# Patient Record
Sex: Female | Born: 1960
Health system: Southern US, Community
[De-identification: ages and names within clinical notes are randomized; demographics above are authoritative.]

## PROBLEM LIST (undated history)

## (undated) DIAGNOSIS — J439 Emphysema, unspecified: Secondary | ICD-10-CM

## (undated) DIAGNOSIS — I10 Essential (primary) hypertension: Secondary | ICD-10-CM

## (undated) HISTORY — DX: Emphysema, unspecified: J43.9

## (undated) HISTORY — DX: Essential (primary) hypertension: I10

## (undated) HISTORY — PX: ROTATOR CUFF REPAIR: SHX139

---

## 2005-07-04 ENCOUNTER — Ambulatory Visit: Payer: Self-pay | Admitting: *Deleted

## 2005-12-17 ENCOUNTER — Ambulatory Visit: Payer: Self-pay | Admitting: Family Medicine

## 2006-07-29 ENCOUNTER — Ambulatory Visit: Payer: Self-pay | Admitting: Family Medicine

## 2007-08-04 ENCOUNTER — Ambulatory Visit: Payer: Self-pay | Admitting: Family Medicine

## 2009-08-07 ENCOUNTER — Ambulatory Visit: Payer: Self-pay | Admitting: Anesthesiology

## 2010-06-26 ENCOUNTER — Ambulatory Visit: Payer: Self-pay | Admitting: Obstetrics and Gynecology

## 2011-06-27 ENCOUNTER — Ambulatory Visit: Payer: Self-pay | Admitting: Obstetrics and Gynecology

## 2012-07-01 ENCOUNTER — Ambulatory Visit: Payer: Self-pay | Admitting: Obstetrics and Gynecology

## 2013-06-24 ENCOUNTER — Ambulatory Visit: Payer: Self-pay | Admitting: Obstetrics and Gynecology

## 2014-07-05 ENCOUNTER — Ambulatory Visit: Payer: Self-pay | Admitting: Obstetrics and Gynecology

## 2015-07-13 ENCOUNTER — Other Ambulatory Visit: Payer: Self-pay | Admitting: Internal Medicine

## 2015-07-13 DIAGNOSIS — Z1231 Encounter for screening mammogram for malignant neoplasm of breast: Secondary | ICD-10-CM

## 2015-07-18 ENCOUNTER — Ambulatory Visit
Admission: RE | Admit: 2015-07-18 | Discharge: 2015-07-18 | Disposition: A | Discharge status: Home / Self Care | Payer: BLUE CROSS/BLUE SHIELD | Source: Ambulatory Visit | Attending: Internal Medicine | Admitting: Internal Medicine

## 2015-07-18 DIAGNOSIS — Z1231 Encounter for screening mammogram for malignant neoplasm of breast: Secondary | ICD-10-CM | POA: Insufficient documentation

## 2016-05-23 DIAGNOSIS — Z0000 Encounter for general adult medical examination without abnormal findings: Secondary | ICD-10-CM | POA: Diagnosis not present

## 2016-07-09 ENCOUNTER — Other Ambulatory Visit: Payer: Self-pay | Admitting: Internal Medicine

## 2016-07-09 DIAGNOSIS — Z1231 Encounter for screening mammogram for malignant neoplasm of breast: Secondary | ICD-10-CM

## 2016-07-24 ENCOUNTER — Ambulatory Visit
Admission: RE | Admit: 2016-07-24 | Discharge: 2016-07-24 | Disposition: A | Discharge status: Home / Self Care | Payer: BLUE CROSS/BLUE SHIELD | Source: Ambulatory Visit | Attending: Internal Medicine | Admitting: Internal Medicine

## 2016-07-24 DIAGNOSIS — Z1231 Encounter for screening mammogram for malignant neoplasm of breast: Secondary | ICD-10-CM | POA: Diagnosis not present

## 2017-04-29 ENCOUNTER — Encounter: Payer: Self-pay | Admitting: Obstetrics and Gynecology

## 2017-04-29 ENCOUNTER — Ambulatory Visit (INDEPENDENT_AMBULATORY_CARE_PROVIDER_SITE_OTHER): Payer: BLUE CROSS/BLUE SHIELD | Admitting: Obstetrics and Gynecology

## 2017-04-29 VITALS — BP 128/76 | Ht 73.0 in | Wt 190.0 lb

## 2017-04-29 DIAGNOSIS — Z1231 Encounter for screening mammogram for malignant neoplasm of breast: Secondary | ICD-10-CM | POA: Diagnosis not present

## 2017-04-29 DIAGNOSIS — Z124 Encounter for screening for malignant neoplasm of cervix: Secondary | ICD-10-CM | POA: Diagnosis not present

## 2017-04-29 DIAGNOSIS — Z1211 Encounter for screening for malignant neoplasm of colon: Secondary | ICD-10-CM

## 2017-04-29 DIAGNOSIS — Z1151 Encounter for screening for human papillomavirus (HPV): Secondary | ICD-10-CM

## 2017-04-29 DIAGNOSIS — Z01419 Encounter for gynecological examination (general) (routine) without abnormal findings: Secondary | ICD-10-CM | POA: Diagnosis not present

## 2017-04-29 DIAGNOSIS — Z1239 Encounter for other screening for malignant neoplasm of breast: Secondary | ICD-10-CM

## 2017-04-29 LAB — HEMOCCULT GUIAC POC 1CARD (OFFICE): FECAL OCCULT BLD: NEGATIVE

## 2017-04-29 NOTE — Progress Notes (Signed)
Chief Complaint  Patient presents with  . Annual Exam    HPI:      [name] is a 56 y.o. [id number] who LMP was No LMP recorded. Patient is postmenopausal., presents today for her annual examination.  Her menses are absent due to menopause. She does not have intermenstrual bleeding.  She does have vasomotor sx.  Sex activity: not sexually active. She does have vaginal dryness.  Last Pap over 3 yrs ago; no hx of abn paps.  Last mammogram: July 24, 2016  Results were: normal--routine follow-up in 12 months There is no FH of breast cancer. There is no FH of ovarian cancer. The patient does not do self-breast exams.  Colonoscopy: never   Tobacco WGN:FAOZHY about 1/3 ppd. Had quit but restarted about 1 month ago. Quit cold [place] initially and can do it again.   Alcohol use: social drinker Exercise: not active  She does not get adequate calcium and Vitamin D in her diet. Labs with PCP.  Past Medical History:  Diagnosis Date  . Emphysema lung ([facility])   . Hypertension     Past Surgical History:  Procedure Laterality Date  . CESAREAN SECTION    . ROTATOR CUFF REPAIR      Family History  Problem Relation Age of Onset  . Stroke Mother   . Cancer Sister   . Breast cancer Neg Hx     Social History   Social History  . Marital status: Divorced    Spouse name: N/A  . Number of children: N/A  . Years of education: N/A   Occupational History  . Not on file.   Social History Main Topics  . Smoking status: Current Every Day Smoker  . Smokeless tobacco: Never Used  . Alcohol use No  . Drug use: No  . Sexual activity: Not Currently    Birth control/ protection: Post-menopausal   Other Topics Concern  . Not on file   Social History Narrative  . No narrative on file     Current Outpatient Prescriptions:  .  amLODipine-benazepril (LOTREL) 5-10 MG capsule, Take 1 capsule by mouth daily., Disp: , Rfl:  .  benazepril (LOTENSIN) 20 MG tablet, Take 20 mg  by mouth daily., Disp: , Rfl:  .  hydrochlorothiazide (MICROZIDE) 12.5 MG capsule, Take 12.5 mg by mouth daily., Disp: , Rfl:    ROS:  Review of Systems  Constitutional: Negative for fatigue, fever and unexpected weight change.  Respiratory: Negative for cough, shortness of breath and wheezing.   Cardiovascular: Negative for chest pain, palpitations and leg swelling.  Gastrointestinal: Negative for blood in stool, constipation, diarrhea, nausea and vomiting.  Endocrine: Negative for cold intolerance, heat intolerance and polyuria.  Genitourinary: Negative for dyspareunia, dysuria, flank pain, frequency, genital sores, hematuria, menstrual problem, pelvic pain, urgency, vaginal bleeding, vaginal discharge and vaginal pain.  Musculoskeletal: Negative for back pain, joint swelling and myalgias.  Skin: Negative for rash.  Neurological: Negative for dizziness, syncope, light-headedness, numbness and headaches.  Hematological: Negative for adenopathy.  Psychiatric/Behavioral: Negative for agitation, confusion, sleep disturbance and suicidal ideas. The patient is not nervous/anxious.      Objective: BP 128/76   Ht 6\' 1"  (1.854 m)   Wt 190 lb (86.2 kg)   BMI 25.07 kg/m     Physical Exam  Constitutional: She is oriented to person, place, and time. She appears well-developed and well-nourished.  Genitourinary: Vagina normal and uterus normal. There is no rash or tenderness on the  right labia. There is no rash or tenderness on the left labia. No erythema or tenderness in the vagina. No vaginal discharge found. Right adnexum does not display mass and does not display tenderness. Left adnexum does not display mass and does not display tenderness. Cervix does not exhibit motion tenderness or polyp. Uterus is not enlarged or tender.  Neck: Normal range of motion. No thyromegaly present.  Cardiovascular: Normal rate, regular rhythm and normal heart sounds.   No murmur heard. Pulmonary/Chest:  Effort normal and breath sounds normal. Right breast exhibits no mass, no nipple discharge, no skin change and no tenderness. Left breast exhibits no mass, no nipple discharge, no skin change and no tenderness.  Abdominal: Soft. There is no tenderness. There is no guarding.  Musculoskeletal: Normal range of motion.  Neurological: She is alert and oriented to person, place, and time. No cranial nerve deficit.  Psychiatric: She has a normal mood and affect. Her behavior is normal.  Vitals reviewed.  Results for orders placed or performed in visit on 04/29/17 (from the past 24 hour(s))  POCT Occult Blood Stool     Status: Normal   Collection Time: 04/29/17  5:10 PM  Result Value Ref Range   Fecal Occult Blood, POC Negative Negative   Card #1 Date     Card #2 Fecal Occult Blod, POC     Card #2 Date     Card #3 Fecal Occult Blood, POC     Card #3 Date       Assessment/Plan:  Encounter for annual routine gynecological examination  Cervical cancer screening - Plan: IGP, Aptima HPV  Screening for HPV (human papillomavirus) - Plan: IGP, Aptima HPV  Screening for breast cancer - Pt does mammo wtih PCP.  Screening for colon cancer - Refer to GI for scr colonoscopy. - Plan: Ambulatory referral to Gastroenterology, POCT Occult Blood Stool         GYN counsel mammography screening, menopause, adequate intake of calcium and vitamin D    F/U  Return in about 1 year (around 04/29/2018).  Robin Day B. Roxan Yamamoto, PA-C 04/29/2017 5:10 PM

## 2017-05-01 LAB — IGP, APTIMA HPV
HPV Aptima: NEGATIVE
PAP SMEAR COMMENT: 0

## 2017-07-17 ENCOUNTER — Other Ambulatory Visit: Payer: Self-pay | Admitting: Internal Medicine

## 2017-07-17 DIAGNOSIS — Z1231 Encounter for screening mammogram for malignant neoplasm of breast: Secondary | ICD-10-CM

## 2017-07-31 ENCOUNTER — Ambulatory Visit
Admission: RE | Admit: 2017-07-31 | Discharge: 2017-07-31 | Disposition: A | Discharge status: Home / Self Care | Payer: BLUE CROSS/BLUE SHIELD | Source: Ambulatory Visit | Attending: Internal Medicine | Admitting: Internal Medicine

## 2017-07-31 DIAGNOSIS — Z1231 Encounter for screening mammogram for malignant neoplasm of breast: Secondary | ICD-10-CM | POA: Insufficient documentation

## 2018-04-06 DIAGNOSIS — R5381 Other malaise: Secondary | ICD-10-CM | POA: Diagnosis not present

## 2018-04-06 DIAGNOSIS — I10 Essential (primary) hypertension: Secondary | ICD-10-CM | POA: Diagnosis not present

## 2018-04-06 DIAGNOSIS — E7849 Other hyperlipidemia: Secondary | ICD-10-CM | POA: Diagnosis not present

## 2018-04-14 DIAGNOSIS — J439 Emphysema, unspecified: Secondary | ICD-10-CM | POA: Diagnosis not present

## 2018-04-14 DIAGNOSIS — Z0000 Encounter for general adult medical examination without abnormal findings: Secondary | ICD-10-CM | POA: Diagnosis not present

## 2018-04-14 DIAGNOSIS — J3089 Other allergic rhinitis: Secondary | ICD-10-CM | POA: Diagnosis not present

## 2018-04-14 DIAGNOSIS — S61011A Laceration without foreign body of right thumb without damage to nail, initial encounter: Secondary | ICD-10-CM | POA: Diagnosis not present

## 2018-04-14 DIAGNOSIS — N951 Menopausal and female climacteric states: Secondary | ICD-10-CM | POA: Diagnosis not present

## 2018-04-15 ENCOUNTER — Telehealth: Payer: Self-pay | Admitting: *Deleted

## 2018-04-15 DIAGNOSIS — Z87891 Personal history of nicotine dependence: Secondary | ICD-10-CM

## 2018-04-15 DIAGNOSIS — Z122 Encounter for screening for malignant neoplasm of respiratory organs: Secondary | ICD-10-CM

## 2018-04-15 NOTE — Telephone Encounter (Signed)
Received referral for initial lung cancer screening scan. Contacted patient and obtained smoking history,(former, quit 6/19, 30.75 pack year) as well as answering questions related to screening process. Patient denies signs of lung cancer such as weight loss or hemoptysis. Patient denies comorbidity that would prevent curative treatment if lung cancer were found. Patient is scheduled for shared decision making visit and CT scan on 05/08/18.

## 2018-04-17 ENCOUNTER — Other Ambulatory Visit: Payer: Self-pay | Admitting: Internal Medicine

## 2018-04-17 DIAGNOSIS — Z1211 Encounter for screening for malignant neoplasm of colon: Secondary | ICD-10-CM

## 2018-04-30 ENCOUNTER — Ambulatory Visit: Payer: BLUE CROSS/BLUE SHIELD

## 2018-05-04 ENCOUNTER — Telehealth: Payer: Self-pay | Admitting: *Deleted

## 2018-05-04 NOTE — Telephone Encounter (Signed)
Patient called to cancel all Lung Ca Screening appts for 05/08/18 w Shawn and stated that she will c/b to [facility] when she is available.  [facility]

## 2018-05-08 ENCOUNTER — Ambulatory Visit: Payer: BLUE CROSS/BLUE SHIELD

## 2018-05-08 ENCOUNTER — Ambulatory Visit: Payer: BLUE CROSS/BLUE SHIELD | Admitting: Oncology

## 2018-05-25 ENCOUNTER — Encounter: Payer: Self-pay | Admitting: Oncology

## 2018-05-26 ENCOUNTER — Inpatient Hospital Stay: Payer: BLUE CROSS/BLUE SHIELD | Attending: Oncology | Admitting: Oncology

## 2018-05-26 ENCOUNTER — Ambulatory Visit
Admission: RE | Admit: 2018-05-26 | Discharge: 2018-05-26 | Disposition: A | Discharge status: Home / Self Care | Payer: BLUE CROSS/BLUE SHIELD | Source: Ambulatory Visit | Attending: Oncology | Admitting: Oncology

## 2018-05-26 DIAGNOSIS — Z87891 Personal history of nicotine dependence: Secondary | ICD-10-CM

## 2018-05-26 DIAGNOSIS — J439 Emphysema, unspecified: Secondary | ICD-10-CM | POA: Insufficient documentation

## 2018-05-26 DIAGNOSIS — Z122 Encounter for screening for malignant neoplasm of respiratory organs: Secondary | ICD-10-CM | POA: Insufficient documentation

## 2018-05-26 NOTE — Progress Notes (Signed)
In accordance with CMS guidelines, patient has met eligibility criteria including age, absence of signs or symptoms of lung cancer.  Social History   Tobacco Use  . Smoking status: Former Smoker    Packs/day: 0.75    Years: 41.00    Pack years: 30.75    Types: Cigarettes    Last attempt to quit: 02/2018    Years since quitting: 0.2  . Smokeless tobacco: Never Used  Substance Use Topics  . Alcohol use: No  . Drug use: No     A shared decision-making session was conducted prior to the performance of CT scan. This includes one or more decision aids, includes benefits and harms of screening, follow-up diagnostic testing, over-diagnosis, false positive rate, and total radiation exposure.  Counseling on the importance of adherence to annual lung cancer LDCT screening, impact of co-morbidities, and ability or willingness to undergo diagnosis and treatment is imperative for compliance of the program.  Counseling on the importance of continued smoking cessation for former smokers; the importance of smoking cessation for current smokers, and information about tobacco cessation interventions have been given to patient including [place] Quit [facility] and 1800 quit [place] programs.  Written order for lung cancer screening with LDCT has been given to the patient and any and all questions have been answered to the best of my abilities.   Yearly follow up will be coordinated by Shawn Perkins, Thoracic Navigator.  Jennifer Burns, NP 05/26/2018 1:15 PM  

## 2018-05-29 ENCOUNTER — Telehealth: Payer: Self-pay | Admitting: *Deleted

## 2018-05-29 NOTE — Telephone Encounter (Signed)
Notified patient of LDCT lung cancer screening program results with recommendation for 12 month follow up imaging. Also notified of incidental findings noted below and is encouraged to discuss further with PCP who will receive a copy of this note and/or the CT report. Patient verbalizes understanding.   IMPRESSION: Lung-RADS 1, negative. Continue annual screening with low-dose chest CT without contrast in 12 months.  Emphysema (ICD10-J43.9).

## 2018-07-28 ENCOUNTER — Other Ambulatory Visit: Payer: Self-pay | Admitting: Internal Medicine

## 2018-07-28 DIAGNOSIS — Z1231 Encounter for screening mammogram for malignant neoplasm of breast: Secondary | ICD-10-CM

## 2018-08-05 ENCOUNTER — Ambulatory Visit
Admission: RE | Admit: 2018-08-05 | Discharge: 2018-08-05 | Disposition: A | Discharge status: Home / Self Care | Payer: BLUE CROSS/BLUE SHIELD | Source: Ambulatory Visit | Attending: Internal Medicine | Admitting: Internal Medicine

## 2018-08-05 DIAGNOSIS — Z1231 Encounter for screening mammogram for malignant neoplasm of breast: Secondary | ICD-10-CM | POA: Diagnosis not present

## 2019-06-01 ENCOUNTER — Telehealth: Payer: Self-pay | Admitting: *Deleted

## 2019-06-01 NOTE — Telephone Encounter (Signed)
Left message for patient to notify them that it is time to schedule annual low dose lung cancer screening CT scan. Instructed patient to call back to verify information prior to the scan being scheduled.  

## 2019-06-22 DIAGNOSIS — I10 Essential (primary) hypertension: Secondary | ICD-10-CM | POA: Diagnosis not present

## 2019-06-22 DIAGNOSIS — R5381 Other malaise: Secondary | ICD-10-CM | POA: Diagnosis not present

## 2019-06-22 DIAGNOSIS — E7849 Other hyperlipidemia: Secondary | ICD-10-CM | POA: Diagnosis not present

## 2019-06-24 DIAGNOSIS — J439 Emphysema, unspecified: Secondary | ICD-10-CM | POA: Diagnosis not present

## 2019-06-24 DIAGNOSIS — Z0000 Encounter for general adult medical examination without abnormal findings: Secondary | ICD-10-CM | POA: Diagnosis not present

## 2019-06-24 DIAGNOSIS — N951 Menopausal and female climacteric states: Secondary | ICD-10-CM | POA: Diagnosis not present

## 2019-06-24 DIAGNOSIS — I10 Essential (primary) hypertension: Secondary | ICD-10-CM | POA: Diagnosis not present

## 2019-06-24 DIAGNOSIS — R0683 Snoring: Secondary | ICD-10-CM | POA: Diagnosis not present

## 2019-08-13 ENCOUNTER — Telehealth: Payer: Self-pay | Admitting: *Deleted

## 2019-08-13 NOTE — Telephone Encounter (Signed)
Left voicemail  for patient to notify her that it is time to schedule annual low dose lung cancer screening CT scan. Instructed patient to call back to verify information prior to the scan being scheduled.

## 2019-10-05 ENCOUNTER — Encounter: Payer: Self-pay | Admitting: *Deleted

## 2020-03-27 ENCOUNTER — Telehealth: Payer: Self-pay | Admitting: *Deleted

## 2020-03-27 MED ORDER — AZITHROMYCIN 250 MG PO TABS: ORAL_TABLET | ORAL | 0 refills | Status: DC

## 2020-03-27 NOTE — Telephone Encounter (Signed)
Per Dr. Juel Burrow okay to send zpak, patient has been advised.

## 2020-03-27 NOTE — Telephone Encounter (Signed)
Patient states that she has had cold sxs for over 2 weeks and has tried OTC remedies, but just not going away. Patient asking if she can get antibiotic called in.

## 2020-05-11 ENCOUNTER — Other Ambulatory Visit: Payer: Self-pay | Admitting: Internal Medicine

## 2020-08-16 ENCOUNTER — Other Ambulatory Visit: Payer: Self-pay | Admitting: Internal Medicine

## 2020-08-17 IMAGING — MG DIGITAL SCREENING BILATERAL MAMMOGRAM WITH TOMO AND CAD
8 series · 8 of 24 positions shown · non-contrast
Comparison: Previous exam(s).

CLINICAL DATA: Screening.

EXAM:
DIGITAL SCREENING BILATERAL MAMMOGRAM WITH TOMO AND CAD

[R CC synth-2D]
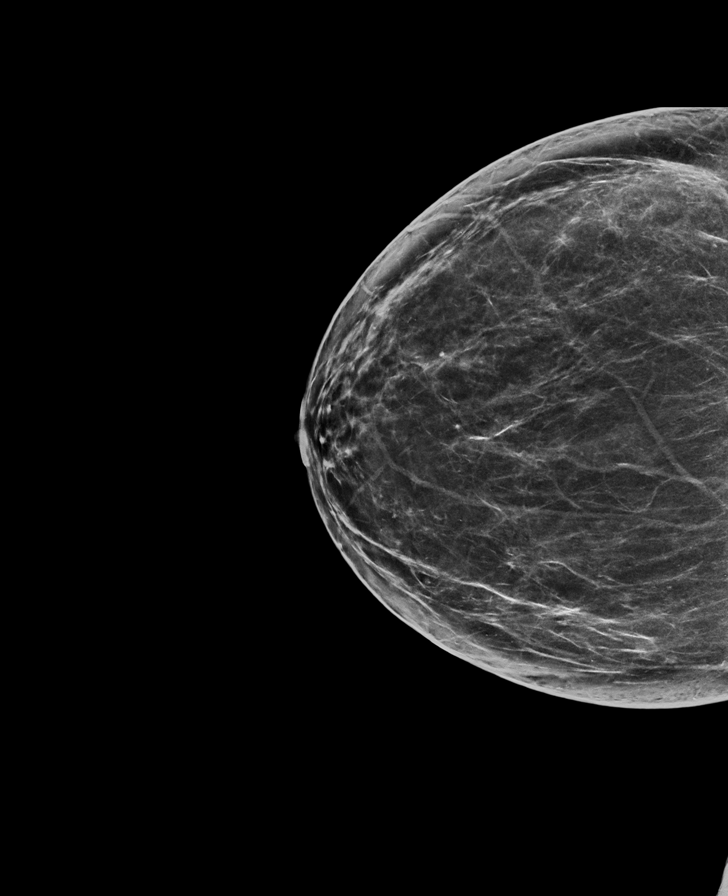

[R MLO synth-2D]
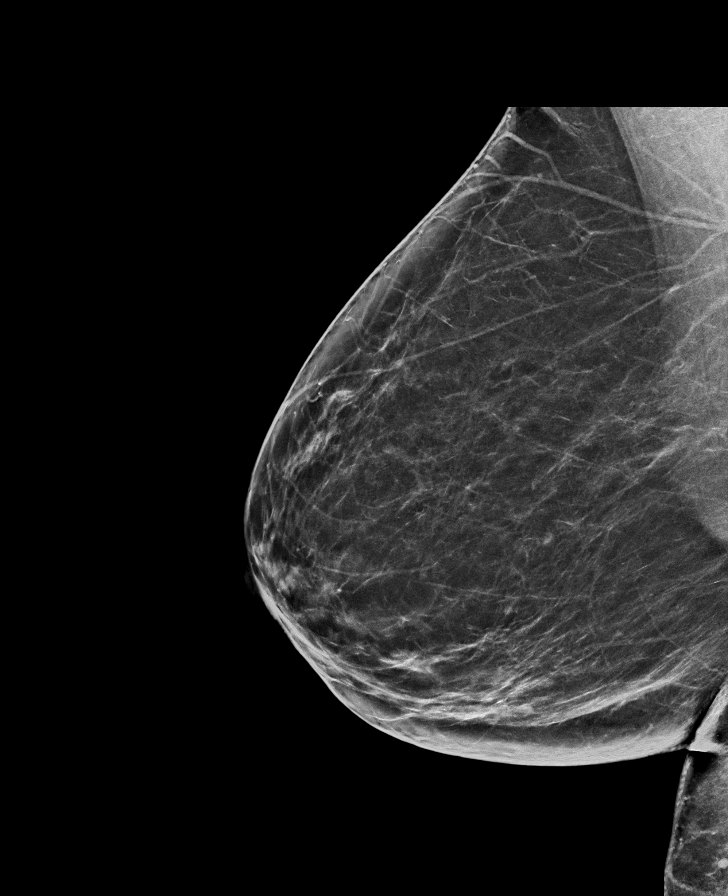

[L MLO synth-2D]
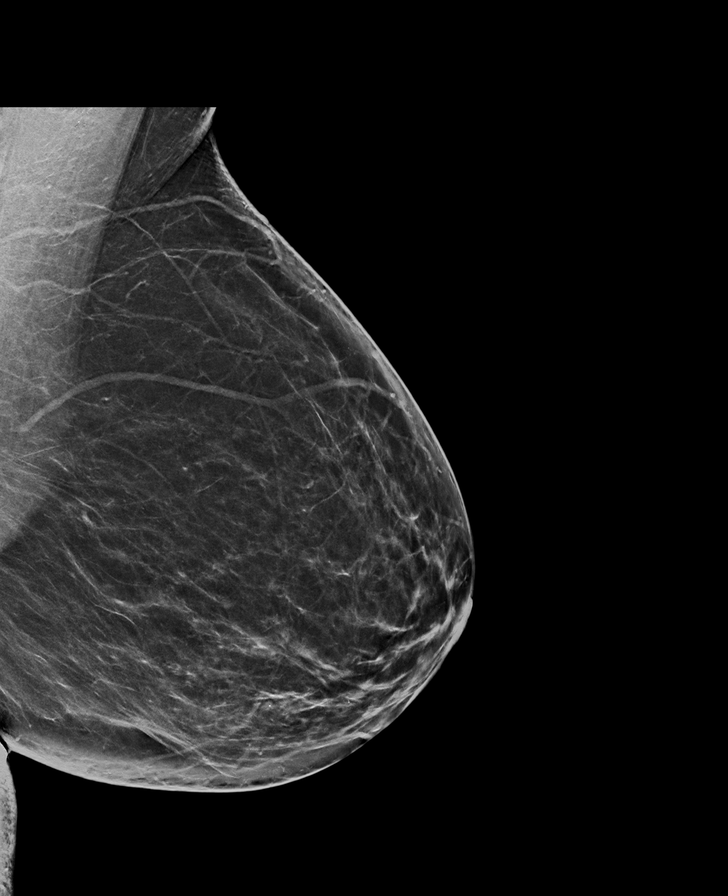

[L CC synth-2D]
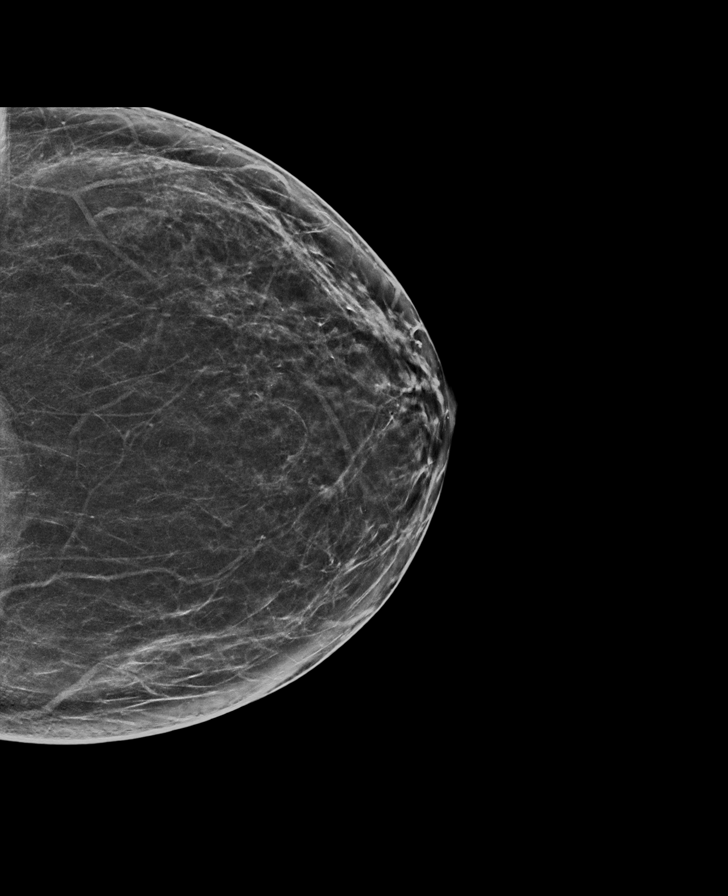

[L MLO tomo · tomo slice 39/76.0]
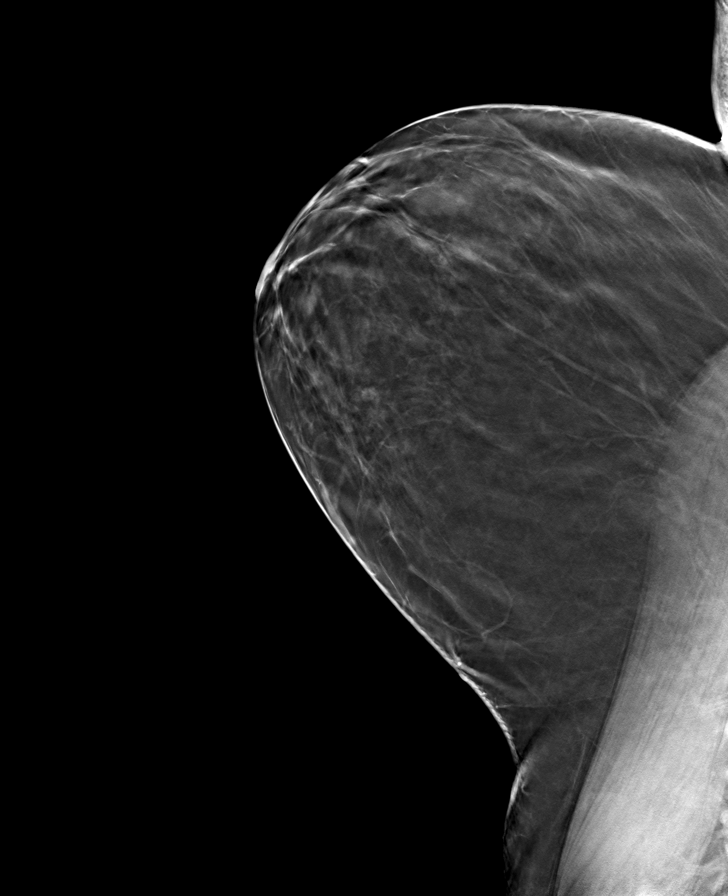

[R MLO tomo · tomo slice 37/72.0]
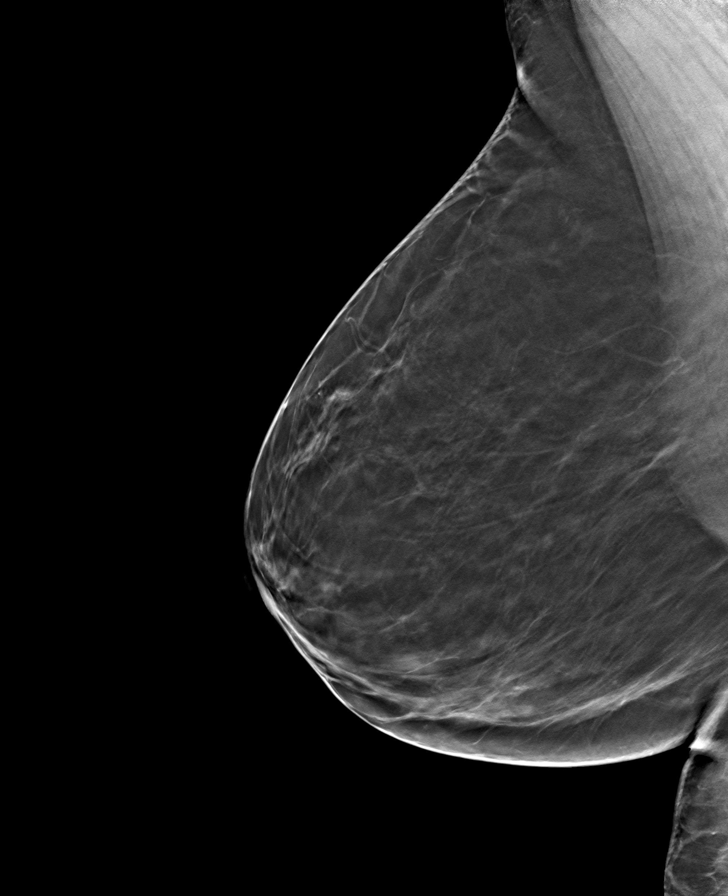

[L CC tomo · tomo slice 34/67.0]
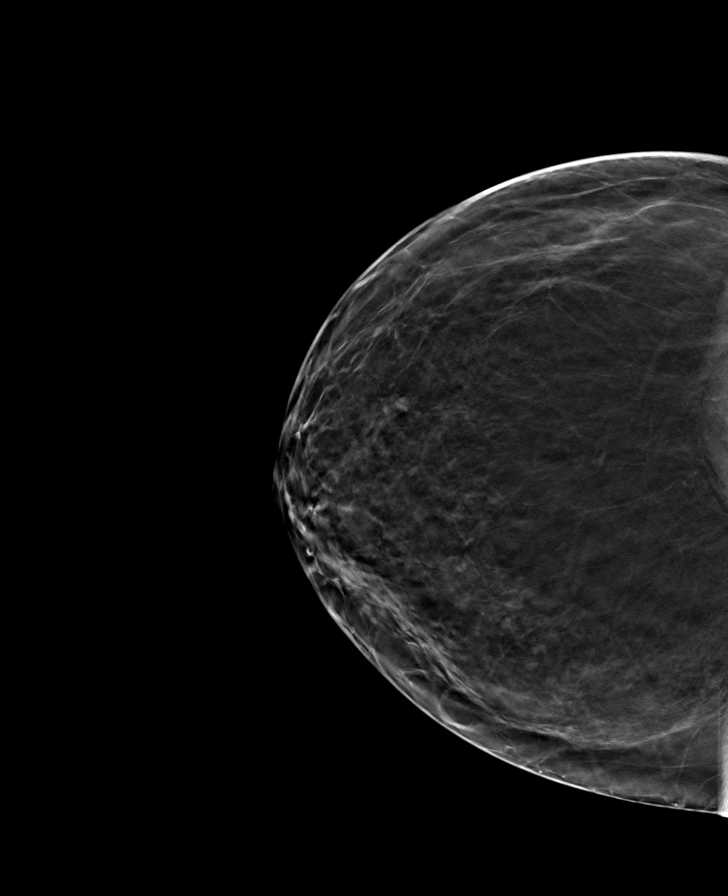

[R CC tomo · tomo slice 37/74.0]
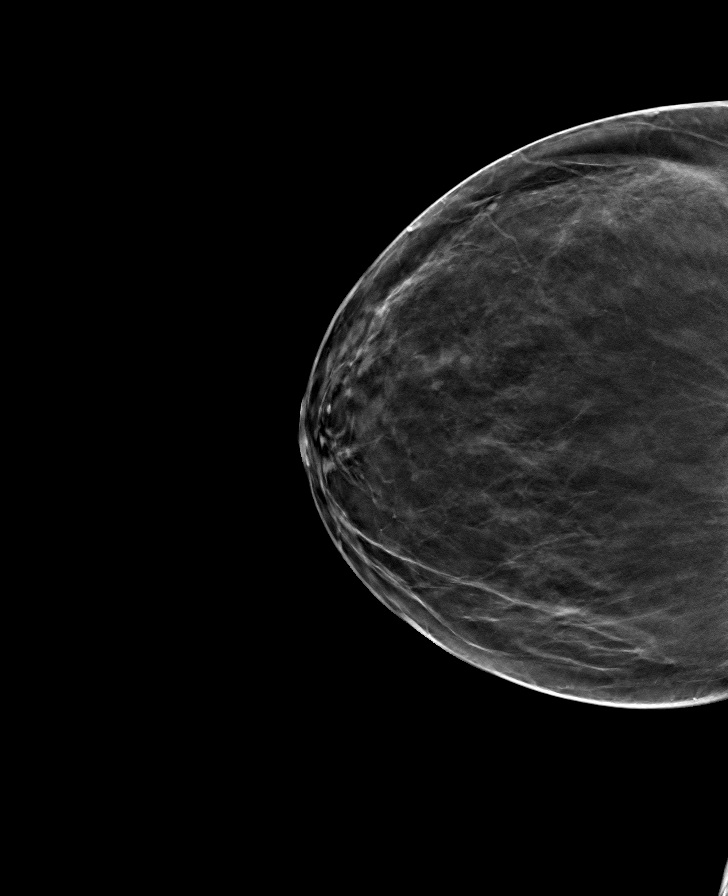

[8 of 24 positions shown; findings below may reference images not displayed]

ACR Breast Density Category b: There are scattered areas of
fibroglandular density.
FINDINGS: There are no findings suspicious for malignancy. Images were
processed with CAD.
IMPRESSION: No mammographic evidence of malignancy. A result letter of this
screening mammogram will be mailed directly to the patient.

RECOMMENDATION:
Screening mammogram in one year. (Code:[id number])

BI-RADS CATEGORY  1: Negative.

## 2020-11-07 ENCOUNTER — Other Ambulatory Visit: Payer: Self-pay

## 2020-11-07 MED ORDER — AMLODIPINE BESYLATE 5 MG PO TABS
5.0000 mg | ORAL_TABLET | Freq: Every day | ORAL | 1 refills | Status: DC
Start: 2020-11-07 — End: 2021-04-23

## 2020-11-12 ENCOUNTER — Other Ambulatory Visit: Payer: Self-pay | Admitting: Internal Medicine

## 2021-02-22 ENCOUNTER — Ambulatory Visit: Payer: BC Managed Care – PPO | Admitting: Family Medicine

## 2021-02-22 ENCOUNTER — Encounter: Payer: Self-pay | Admitting: Family Medicine

## 2021-02-22 VITALS — BP 139/83 | HR 82 | Ht 73.0 in | Wt 172.7 lb

## 2021-02-22 DIAGNOSIS — F17218 Nicotine dependence, cigarettes, with other nicotine-induced disorders: Secondary | ICD-10-CM

## 2021-02-22 DIAGNOSIS — F1010 Alcohol abuse, uncomplicated: Secondary | ICD-10-CM | POA: Insufficient documentation

## 2021-02-22 DIAGNOSIS — J210 Acute bronchiolitis due to respiratory syncytial virus: Secondary | ICD-10-CM | POA: Insufficient documentation

## 2021-02-22 DIAGNOSIS — I10 Essential (primary) hypertension: Secondary | ICD-10-CM

## 2021-02-22 DIAGNOSIS — F17200 Nicotine dependence, unspecified, uncomplicated: Secondary | ICD-10-CM | POA: Insufficient documentation

## 2021-02-22 LAB — COMPLETE METABOLIC PANEL WITH GFR
AG Ratio: 1.5 (calc) (ref 1.0–2.5)
ALT: 14 U/L (ref 6–29)
AST: 15 U/L (ref 10–35)
Albumin: 4.4 g/dL (ref 3.6–5.1)
Alkaline phosphatase (APISO): 93 U/L (ref 37–153)
BUN: 12 mg/dL (ref 7–25)
CO2: 28 mmol/L (ref 20–32)
Calcium: 9.7 mg/dL (ref 8.6–10.4)
Chloride: 104 mmol/L (ref 98–110)
Creat: 0.67 mg/dL (ref 0.50–1.05)
GFR, Est African American: 112 mL/min/{1.73_m2} (ref >=60)
GFR, Est Non African American: 96 mL/min/{1.73_m2} (ref >=60)
Globulin: 3 g/dL (calc) (ref 1.9–3.7)
Glucose, Bld: 92 mg/dL (ref 65–99)
Potassium: 4.2 mmol/L (ref 3.5–5.3)
Sodium: 139 mmol/L (ref 135–146)
Total Bilirubin: 0.7 mg/dL (ref 0.2–1.2)
Total Protein: 7.4 g/dL (ref 6.1–8.1)

## 2021-02-22 LAB — CBC WITH DIFFERENTIAL/PLATELET
Absolute Monocytes: 927 cells/uL (ref 200–950)
Basophils Absolute: 41 cells/uL (ref 0–200)
Basophils Relative: 0.5 %
Eosinophils Absolute: 394 cells/uL (ref 15–500)
Eosinophils Relative: 4.8 %
HCT: 41.9 % (ref 35.0–45.0)
Hemoglobin: 13.8 g/dL (ref 11.7–15.5)
Lymphs Abs: 910 cells/uL (ref 850–3900)
MCH: 27.3 pg (ref 27.0–33.0)
MCHC: 32.9 g/dL (ref 32.0–36.0)
MCV: 83 fL (ref 80.0–100.0)
MPV: 9.3 fL (ref 7.5–12.5)
Monocytes Relative: 11.3 %
Neutro Abs: 5929 cells/uL (ref 1500–7800)
Neutrophils Relative %: 72.3 %
Platelets: 394 10*3/uL (ref 140–400)
RBC: 5.05 10*6/uL (ref 3.80–5.10)
RDW: 13.6 % (ref 11.0–15.0)
Total Lymphocyte: 11.1 %
WBC: 8.2 10*3/uL (ref 3.8–10.8)

## 2021-02-22 MED ORDER — AZITHROMYCIN 250 MG PO TABS: ORAL_TABLET | ORAL | 0 refills | Status: AC

## 2021-02-22 NOTE — Assessment & Plan Note (Signed)
Patient with 21-28 beers per week, wants to cut back for her grandchildren. Plan- Labs to check liver function today.

## 2021-02-22 NOTE — Assessment & Plan Note (Signed)
Patient's blood pressure is within the desired range. Medication side effects include: no side effects noted Continue current treatment regimen. Taking all meds as rx. Doing CMP today to check renal function.

## 2021-02-22 NOTE — Assessment & Plan Note (Signed)
Greater than 25 pack year smoker. No wheezing but patient has cough today  Plan- Ordering Low Dose CT chest screening.

## 2021-02-22 NOTE — Progress Notes (Signed)
Established Patient Office Visit  SUBJECTIVE:  Subjective  Patient ID: Robin Day, female    DOB: December 27, 1960  Age: 60 y.o. MRN: [id number]  CC:  Chief Complaint  Patient presents with   Cough    Patient complains of cough x2 weeks, no other sxs.    HPI Robin Day is a 60 y.o. female presenting today for cough x 2 weeks, says that she has not felt well with possible low grade fever.   Past Medical History:  Diagnosis Date   Emphysema lung ([facility])    Hypertension     Past Surgical History:  Procedure Laterality Date   CESAREAN SECTION     ROTATOR CUFF REPAIR      Family History  Problem Relation Age of Onset   Stroke Mother    Cancer Sister    Breast cancer Neg Hx     Social History   Socioeconomic History   Marital status: Divorced    Spouse name: Not on file   Number of children: Not on file   Years of education: Not on file   Highest education level: Not on file  Occupational History   Not on file  Tobacco Use   Smoking status: Every Day    Packs/day: 0.75    Years: 41.00    Pack years: 30.75    Types: Cigarettes    Last attempt to quit: 02/2018    Years since quitting: 3.0   Smokeless tobacco: Never  Substance and Sexual Activity   Alcohol use: No   Drug use: No   Sexual activity: Not Currently    Birth control/protection: Post-menopausal  Other Topics Concern   Not on file  Social History Narrative   Not on file   Social Determinants of Health   Financial Resource Strain: Not on file  Food Insecurity: Not on file  Transportation Needs: Not on file  Physical Activity: Not on file  Stress: Not on file  Social Connections: Not on file  Intimate Partner Violence: Not on file     Current Outpatient Medications:    amLODipine (NORVASC) 5 MG tablet, Take 1 tablet (5 mg total) by mouth daily., Disp: 90 tablet, Rfl: 1   azithromycin (ZITHROMAX) 250 MG tablet, Take 2 tablets on day 1, then 1 tablet daily on days 2 through 5, Disp: 6  tablet, Rfl: 0   benazepril (LOTENSIN) 20 MG tablet, TAKE 1 TABLET BY MOUTH EVERY DAY, Disp: 90 tablet, Rfl: 1   hydrochlorothiazide (HYDRODIURIL) 12.5 MG tablet, TAKE 1 TABLET BY MOUTH EVERY DAY, Disp: 90 tablet, Rfl: 2   No Known Allergies  ROS Review of Systems  Constitutional: Negative.   HENT:  Positive for congestion.   Respiratory:  Positive for cough.   Genitourinary: Negative.   Neurological: Negative.   Psychiatric/Behavioral: Negative.      OBJECTIVE:    Physical Exam HENT:     Right Ear: Tympanic membrane normal.     Left Ear: Tympanic membrane normal.     Mouth/Throat:     Mouth: Mucous membranes are moist.  Cardiovascular:     Rate and Rhythm: Normal rate and regular rhythm.  Pulmonary:     Effort: No respiratory distress.     Breath sounds: No stridor. No wheezing.  Musculoskeletal:     Cervical back: Normal range of motion.  Skin:    General: Skin is warm.  Psychiatric:        Mood and Affect: Mood normal.  BP 139/83   Pulse 82   Ht 6' 1"  (1.854 m)   Wt 172 lb 11.2 oz (78.3 kg)   SpO2 99%   BMI 22.79 kg/m  Wt Readings from Last 3 Encounters:  02/22/21 172 lb 11.2 oz (78.3 kg)  05/26/18 190 lb (86.2 kg)  04/29/17 190 lb (86.2 kg)    Health Maintenance Due  Topic Date Due   COVID-19 Vaccine (1) Never done   HIV Screening  Never done   Hepatitis C Screening  Never done   TETANUS/TDAP  Never done   COLONOSCOPY (Pts 45-55yr Insurance coverage will need to be confirmed)  Never done   Zoster Vaccines- Shingrix (1 of 2) Never done   PAP SMEAR-Modifier  04/29/2020   MAMMOGRAM  08/05/2020    There are no preventive care reminders to display for this patient.  No flowsheet data found. No flowsheet data found.  No results found for: TSH No results found for: ALBUMIN, ANIONGAP, EGFR, GFR No results found for: CHOL, HDL, LDLCALC, CHOLHDL No results found for: TRIG No results found for: HGBA1C    ASSESSMENT & PLAN:   Problem List Items  Addressed This Visit       Cardiovascular and Mediastinum   Primary hypertension    Patient's blood pressure is within the desired range. Medication side effects include: no side effects noted Continue current treatment regimen. Taking all meds as rx. Doing CMP today to check renal function.         Relevant Orders   COMPLETE METABOLIC PANEL WITH GFR     Respiratory   RSV bronchiolitis - Primary   Relevant Medications   azithromycin (ZITHROMAX) 250 MG tablet   Other Relevant Orders   CBC w/Diff/Platelet     Other   Alcohol abuse    Patient with 21-28 beers per week, wants to cut back for her grandchildren. Plan- Labs to check liver function today.        Relevant Orders   COMPLETE METABOLIC PANEL WITH GFR   Nicotine dependence    Greater than 25 pack year smoker. No wheezing but patient has cough today  Plan- Ordering Low Dose CT chest screening.        Relevant Orders   CT CHEST LUNG CA SCREEN LOW DOSE W/O CM    Meds ordered this encounter  Medications   azithromycin (ZITHROMAX) 250 MG tablet    Sig: Take 2 tablets on day 1, then 1 tablet daily on days 2 through 5    Dispense:  6 tablet    Refill:  0      Follow-up: No follow-ups on file.    KBeckie Salts [id number] [place] [id number]

## 2021-04-22 ENCOUNTER — Other Ambulatory Visit: Payer: Self-pay | Admitting: Internal Medicine

## 2021-04-26 ENCOUNTER — Other Ambulatory Visit: Payer: Self-pay | Admitting: *Deleted

## 2021-04-26 DIAGNOSIS — Z1231 Encounter for screening mammogram for malignant neoplasm of breast: Secondary | ICD-10-CM

## 2021-05-19 ENCOUNTER — Other Ambulatory Visit: Payer: Self-pay | Admitting: Internal Medicine

## 2021-09-24 ENCOUNTER — Other Ambulatory Visit: Payer: Self-pay

## 2021-09-24 MED ORDER — AZITHROMYCIN 250 MG PO TABS: ORAL_TABLET | ORAL | 0 refills | Status: AC

## 2021-10-11 ENCOUNTER — Other Ambulatory Visit: Payer: Self-pay | Admitting: Internal Medicine

## 2021-12-24 ENCOUNTER — Other Ambulatory Visit: Payer: Self-pay | Admitting: Internal Medicine

## 2022-03-25 ENCOUNTER — Other Ambulatory Visit: Payer: Self-pay | Admitting: Internal Medicine

## 2022-04-15 ENCOUNTER — Other Ambulatory Visit: Payer: Self-pay | Admitting: *Deleted

## 2022-04-15 MED ORDER — HYDROCHLOROTHIAZIDE 12.5 MG PO TABS: 12.5000 mg | ORAL_TABLET | Freq: Every day | ORAL | 2 refills | Status: DC

## 2022-04-15 MED ORDER — FLUTICASONE PROPIONATE 50 MCG/ACT NA SUSP: 2.0000 | Freq: Every day | NASAL | 6 refills | Status: DC

## 2022-04-15 MED ORDER — AMLODIPINE BESYLATE 5 MG PO TABS: 5.0000 mg | ORAL_TABLET | Freq: Every day | ORAL | 2 refills | Status: DC

## 2022-08-05 ENCOUNTER — Other Ambulatory Visit: Payer: Self-pay | Admitting: Internal Medicine

## 2022-09-03 ENCOUNTER — Other Ambulatory Visit: Payer: Self-pay | Admitting: Internal Medicine

## 2022-09-06 ENCOUNTER — Other Ambulatory Visit: Payer: Self-pay | Admitting: Internal Medicine

## 2022-10-07 ENCOUNTER — Other Ambulatory Visit: Payer: Self-pay | Admitting: Internal Medicine

## 2023-07-10 ENCOUNTER — Ambulatory Visit: Payer: 59 | Admitting: Emergency Medicine

## 2023-07-10 ENCOUNTER — Encounter: Payer: Self-pay | Admitting: Emergency Medicine

## 2023-07-10 VITALS — BP 136/86 | HR 88 | Temp 98.6°F | Ht 73.0 in | Wt 180.4 lb

## 2023-07-10 DIAGNOSIS — I10 Essential (primary) hypertension: Secondary | ICD-10-CM

## 2023-07-10 DIAGNOSIS — F1010 Alcohol abuse, uncomplicated: Secondary | ICD-10-CM | POA: Diagnosis not present

## 2023-07-10 DIAGNOSIS — Z7689 Persons encountering health services in other specified circumstances: Secondary | ICD-10-CM

## 2023-07-10 DIAGNOSIS — F17200 Nicotine dependence, unspecified, uncomplicated: Secondary | ICD-10-CM | POA: Diagnosis not present

## 2023-07-10 DIAGNOSIS — Z1211 Encounter for screening for malignant neoplasm of colon: Secondary | ICD-10-CM

## 2023-07-10 DIAGNOSIS — Z1231 Encounter for screening mammogram for malignant neoplasm of breast: Secondary | ICD-10-CM

## 2023-07-10 LAB — LIPID PANEL
Cholesterol: 192 mg/dL (ref 0–200)
HDL: 53.6 mg/dL (ref >39.00)
LDL Cholesterol: 125 mg/dL — ABNORMAL HIGH (ref 0–99)
NonHDL: 138
Total CHOL/HDL Ratio: 4
Triglycerides: 64 mg/dL (ref 0.0–149.0)
VLDL: 12.8 mg/dL (ref 0.0–40.0)

## 2023-07-10 LAB — CBC WITH DIFFERENTIAL/PLATELET
Basophils Absolute: 0 10*3/uL (ref 0.0–0.1)
Basophils Relative: 0.5 % (ref 0.0–3.0)
Eosinophils Absolute: 0.5 10*3/uL (ref 0.0–0.7)
Eosinophils Relative: 5.8 % — ABNORMAL HIGH (ref 0.0–5.0)
HCT: 41.9 % (ref 36.0–46.0)
Hemoglobin: 13.5 g/dL (ref 12.0–15.0)
Lymphocytes Relative: 18.5 % (ref 12.0–46.0)
Lymphs Abs: 1.7 10*3/uL (ref 0.7–4.0)
MCHC: 32.2 g/dL (ref 30.0–36.0)
MCV: 82 fL (ref 78.0–100.0)
Monocytes Absolute: 0.9 10*3/uL (ref 0.1–1.0)
Monocytes Relative: 10.5 % (ref 3.0–12.0)
Neutro Abs: 5.8 10*3/uL (ref 1.4–7.7)
Neutrophils Relative %: 64.7 % (ref 43.0–77.0)
Platelets: 418 10*3/uL — ABNORMAL HIGH (ref 150.0–400.0)
RBC: 5.11 Mil/uL (ref 3.87–5.11)
RDW: 15 % (ref 11.5–15.5)
WBC: 9 10*3/uL (ref 4.0–10.5)

## 2023-07-10 LAB — COMPREHENSIVE METABOLIC PANEL
ALT: 12 U/L (ref 0–35)
AST: 14 U/L (ref 0–37)
Albumin: 4.4 g/dL (ref 3.5–5.2)
Alkaline Phosphatase: 79 U/L (ref 39–117)
BUN: 9 mg/dL (ref 6–23)
CO2: 30 meq/L (ref 19–32)
Calcium: 9.5 mg/dL (ref 8.4–10.5)
Chloride: 103 meq/L (ref 96–112)
Creatinine, Ser: 0.7 mg/dL (ref 0.40–1.20)
GFR: 92.87 mL/min (ref >60.00)
Glucose, Bld: 94 mg/dL (ref 70–99)
Potassium: 4 meq/L (ref 3.5–5.1)
Sodium: 138 meq/L (ref 135–145)
Total Bilirubin: 0.6 mg/dL (ref 0.2–1.2)
Total Protein: 7.7 g/dL (ref 6.0–8.3)

## 2023-07-10 LAB — HEMOGLOBIN A1C: Hgb A1c MFr Bld: 5.9 % (ref 4.6–6.5)

## 2023-07-10 NOTE — Patient Instructions (Signed)

## 2023-07-10 NOTE — Assessment & Plan Note (Signed)
No longer drinking. Dry for over a year

## 2023-07-10 NOTE — Assessment & Plan Note (Signed)
Well-controlled hypertension Cardiovascular risks associated with hypertension discussed Benefits of exercise discussed Diet and nutrition discussed Continue amlodipine 5 mg, benazepril 20 mg, and hydrochlorothiazide 12.5 mg daily Blood work done today Follow-up in 6 months

## 2023-07-10 NOTE — Assessment & Plan Note (Addendum)
Cardiovascular and cancer risks associated with smoking discussed. Smoking cessation advice given Recommend lung cancer screening

## 2023-07-10 NOTE — Progress Notes (Signed)
Robin Day 62 y.o.   Chief Complaint  Patient presents with   Establish Care    Establishing Care, notes of chronic sinus issues throughout the year.     HISTORY OF PRESENT ILLNESS: This is a 62 y.o. female first visit to this office, here to establish care with me. History of hypertension on amlodipine hydrochlorothiazide and benazepril Current smoker. No other chronic medical conditions. No other complaints or medical concerns today.  HPI   Prior to Admission medications   Medication Sig Start Date End Date Taking? Authorizing Provider  amLODipine (NORVASC) 5 MG tablet Take 1 tablet (5 mg total) by mouth daily. 04/15/22  Yes Masoud, Renda Rolls, MD  aspirin EC 81 MG tablet Take 81 mg by mouth daily. Swallow whole.   Yes [provider]  benazepril (LOTENSIN) 20 MG tablet TAKE 1 TABLET BY MOUTH EVERY DAY 08/07/22  Yes Masoud, Renda Rolls, MD  cetirizine (ZYRTEC) 10 MG tablet Take 10 mg by mouth daily.   Yes [provider]  hydrochlorothiazide (HYDRODIURIL) 12.5 MG tablet Take 1 tablet (12.5 mg total) by mouth daily. 04/15/22  Yes Masoud, Renda Rolls, MD  fluticasone (FLONASE) 50 MCG/ACT nasal spray Place 2 sprays into both nostrils daily. Patient not taking: Reported on 07/10/2023 04/15/22   Corky Downs, MD    No Known Allergies  Patient Active Problem List   Diagnosis Date Noted   Primary hypertension 02/22/2021   Alcohol abuse 02/22/2021   Current smoker 02/22/2021    Past Medical History:  Diagnosis Date   Emphysema lung (HCC)    Hypertension     Past Surgical History:  Procedure Laterality Date   CESAREAN SECTION     ROTATOR CUFF REPAIR      Social History   Socioeconomic History   Marital status: Divorced    Spouse name: Not on file   Number of children: Not on file   Years of education: Not on file   Highest education level: Not on file  Occupational History   Not on file  Tobacco Use   Smoking status: Every Day    Current packs/day: 0.00     Average packs/day: 0.8 packs/day for 41.0 years (30.8 ttl pk-yrs)    Types: Cigarettes    Start date: 02/1977    Last attempt to quit: 02/2018    Years since quitting: 5.4   Smokeless tobacco: Never  Substance and Sexual Activity   Alcohol use: No   Drug use: No   Sexual activity: Not Currently    Birth control/protection: Post-menopausal  Other Topics Concern   Not on file  Social History Narrative   Not on file   Social Determinants of Health   Financial Resource Strain: Not on file  Food Insecurity: Not on file  Transportation Needs: Not on file  Physical Activity: Not on file  Stress: Not on file  Social Connections: Not on file  Intimate Partner Violence: Not on file    Family History  Problem Relation Age of Onset   Stroke Mother    Cancer Sister    Breast cancer Neg Hx      Review of Systems  Constitutional: Negative.   HENT: Negative.  Negative for sore throat.   Respiratory: Negative.  Negative for cough and shortness of breath.   Cardiovascular: Negative.  Negative for chest pain and palpitations.  Gastrointestinal:  Negative for abdominal pain, diarrhea, nausea and vomiting.  Genitourinary: Negative.  Negative for dysuria and hematuria.  Skin: Negative.   Neurological: Negative.  Negative for dizziness and headaches.  Endo/Heme/Allergies:  Positive for environmental allergies.  All other systems reviewed and are negative.   Vitals:   07/10/23 0836  BP: 136/86  Pulse: 88  Temp: 98.6 F (37 C)  SpO2: 99%    Physical Exam Vitals reviewed.  Constitutional:      Appearance: Normal appearance.  HENT:     Head: Normocephalic.     Mouth/Throat:     Mouth: Mucous membranes are moist.     Pharynx: Oropharynx is clear.  Eyes:     Extraocular Movements: Extraocular movements intact.     Conjunctiva/sclera: Conjunctivae normal.     Pupils: Pupils are equal, round, and reactive to light.  Cardiovascular:     Rate and Rhythm: Normal rate and  regular rhythm.     Pulses: Normal pulses.     Heart sounds: Normal heart sounds.  Pulmonary:     Effort: Pulmonary effort is normal.     Breath sounds: Normal breath sounds.  Musculoskeletal:     Cervical back: No tenderness.  Lymphadenopathy:     Cervical: No cervical adenopathy.  Skin:    General: Skin is warm and dry.     Capillary Refill: Capillary refill takes less than 2 seconds.  Neurological:     General: No focal deficit present.     Mental Status: She is alert and oriented to person, place, and time.  Psychiatric:        Mood and Affect: Mood normal.        Behavior: Behavior normal.      ASSESSMENT & PLAN: A total of 48 minutes was spent with the patient and counseling/coordination of care regarding preparing for this visit, review of available medical records, comprehensive history and physical examination, establishing care with me, diagnosis of hypertension and cardiovascular risks associated with this condition, need for blood work today, review of all medications, smoking cessation advice, review of health maintenance items and need for colon and breast cancer screening, prognosis, documentation, and need for follow-up.  Problem List Items Addressed This Visit       Cardiovascular and Mediastinum   Primary hypertension - Primary    Well-controlled hypertension Cardiovascular risks associated with hypertension discussed Benefits of exercise discussed Diet and nutrition discussed Continue amlodipine 5 mg, benazepril 20 mg, and hydrochlorothiazide 12.5 mg daily Blood work done today Follow-up in 6 months      Relevant Medications   aspirin EC 81 MG tablet   Other Relevant Orders   CBC with Differential/Platelet   Comprehensive metabolic panel   Hemoglobin A1c   Lipid panel     Other   Alcohol abuse    No longer drinking. Dry for over a year      Current smoker    Cardiovascular and cancer risks associated with smoking discussed. Smoking cessation  advice given Recommend lung cancer screening      Relevant Orders   Ambulatory Referral for Lung Cancer Scre   Other Visit Diagnoses     Encounter to establish care       Relevant Orders   Ambulatory referral to Obstetrics / Gynecology   Screening for colon cancer       Relevant Orders   Ambulatory referral to Gastroenterology   Screening mammogram for breast cancer       Relevant Orders   MM Digital Screening      Patient Instructions  Health Maintenance, Female Adopting a healthy lifestyle and getting preventive care are important in promoting  health and wellness. Ask your health care provider about: The right schedule for you to have regular tests and exams. Things you can do on your own to prevent diseases and keep yourself healthy. What should I know about diet, weight, and exercise? Eat a healthy diet  Eat a diet that includes plenty of vegetables, fruits, low-fat dairy products, and lean protein. Do not eat a lot of foods that are high in solid fats, added sugars, or sodium. Maintain a healthy weight Body mass index (BMI) is used to identify weight problems. It estimates body fat based on height and weight. Your health care provider can help determine your BMI and help you achieve or maintain a healthy weight. Get regular exercise Get regular exercise. This is one of the most important things you can do for your health. Most adults should: Exercise for at least 150 minutes each week. The exercise should increase your heart rate and make you sweat (moderate-intensity exercise). Do strengthening exercises at least twice a week. This is in addition to the moderate-intensity exercise. Spend less time sitting. Even light physical activity can be beneficial. Watch cholesterol and blood lipids Have your blood tested for lipids and cholesterol at 62 years of age, then have this test every 5 years. Have your cholesterol levels checked more often if: Your lipid or cholesterol  levels are high. You are older than 62 years of age. You are at high risk for heart disease. What should I know about cancer screening? Depending on your health history and family history, you may need to have cancer screening at various ages. This may include screening for: Breast cancer. Cervical cancer. Colorectal cancer. Skin cancer. Lung cancer. What should I know about heart disease, diabetes, and high blood pressure? Blood pressure and heart disease High blood pressure causes heart disease and increases the risk of stroke. This is more likely to develop in people who have high blood pressure readings or are overweight. Have your blood pressure checked: Every 3-5 years if you are 47-49 years of age. Every year if you are 27 years old or older. Diabetes Have regular diabetes screenings. This checks your fasting blood sugar level. Have the screening done: Once every three years after age 62 if you are at a normal weight and have a low risk for diabetes. More often and at a younger age if you are overweight or have a high risk for diabetes. What should I know about preventing infection? Hepatitis B If you have a higher risk for hepatitis B, you should be screened for this virus. Talk with your health care provider to find out if you are at risk for hepatitis B infection. Hepatitis C Testing is recommended for: Everyone born from [birth year] through 1965. Anyone with known risk factors for hepatitis C. Sexually transmitted infections (STIs) Get screened for STIs, including gonorrhea and chlamydia, if: You are sexually active and are younger than 62 years of age. You are older than 62 years of age and your health care provider tells you that you are at risk for this type of infection. Your sexual activity has changed since you were last screened, and you are at increased risk for chlamydia or gonorrhea. Ask your health care provider if you are at risk. Ask your health care provider about  whether you are at high risk for HIV. Your health care provider may recommend a prescription medicine to help prevent HIV infection. If you choose to take medicine to prevent HIV, you should first get  tested for HIV. You should then be tested every 3 months for as long as you are taking the medicine. Pregnancy If you are about to stop having your period (premenopausal) and you may become pregnant, seek counseling before you get pregnant. Take 400 to 800 micrograms (mcg) of folic acid every day if you become pregnant. Ask for birth control (contraception) if you want to prevent pregnancy. Osteoporosis and menopause Osteoporosis is a disease in which the bones lose minerals and strength with aging. This can result in bone fractures. If you are 67 years old or older, or if you are at risk for osteoporosis and fractures, ask your health care provider if you should: Be screened for bone loss. Take a calcium or vitamin D supplement to lower your risk of fractures. Be given hormone replacement therapy (HRT) to treat symptoms of menopause. Follow these instructions at home: Alcohol use Do not drink alcohol if: Your health care provider tells you not to drink. You are pregnant, may be pregnant, or are planning to become pregnant. If you drink alcohol: Limit how much you have to: 0-1 drink a day. Know how much alcohol is in your drink. In the [place] one drink equals one 12 oz bottle of beer (355 mL), one 5 oz glass of wine (148 mL), or one 1 oz glass of hard liquor (44 mL). Lifestyle Do not use any products that contain nicotine or tobacco. These products include cigarettes, chewing tobacco, and vaping devices, such as e-cigarettes. If you need help quitting, ask your health care provider. Do not use street drugs. Do not share needles. Ask your health care provider for help if you need support or information about quitting drugs. General instructions Schedule regular health, dental, and eye  exams. Stay current with your vaccines. Tell your health care provider if: You often feel depressed. You have ever been abused or do not feel safe at home. Summary Adopting a healthy lifestyle and getting preventive care are important in promoting health and wellness. Follow your health care provider's instructions about healthy diet, exercising, and getting tested or screened for diseases. Follow your health care provider's instructions on monitoring your cholesterol and blood pressure. This information is not intended to replace advice given to you by your health care provider. Make sure you discuss any questions you have with your health care provider. Document Revised: 01/22/2021 Document Reviewed: 01/22/2021 Elsevier Patient Education  2024 Elsevier Inc.      Edwina Barth, MD [facility]

## 2023-07-14 ENCOUNTER — Other Ambulatory Visit: Payer: Self-pay | Admitting: *Deleted

## 2023-07-14 ENCOUNTER — Telehealth: Payer: Self-pay | Admitting: Emergency Medicine

## 2023-07-14 MED ORDER — BENAZEPRIL HCL 20 MG PO TABS: 20.0000 mg | ORAL_TABLET | Freq: Every day | ORAL | 1 refills | Status: DC

## 2023-07-14 NOTE — Telephone Encounter (Signed)
Prescription Request  07/14/2023  LOV: 07/10/2023  What is the name of the medication or equipment? benazepril (LOTENSIN) 20 MG tablet   Have you contacted your pharmacy to request a refill? No   Which pharmacy would you like this sent to?   [id number] - [place], [place] - [address] [place] [address] Phone: [phone] Fax: [phone]    Patient notified that their request is being sent to the clinical staff for review and that they should receive a response within 2 business days.   Please advise at Mobile [phone] (mobile)   Patient asked if she could have a 90 day supply.

## 2023-07-14 NOTE — Telephone Encounter (Signed)
Okay to prescribe.  Thanks

## 2023-07-14 NOTE — Telephone Encounter (Signed)
Sent RX to pharmacy 

## 2023-08-07 ENCOUNTER — Other Ambulatory Visit: Payer: Self-pay

## 2023-08-07 DIAGNOSIS — Z122 Encounter for screening for malignant neoplasm of respiratory organs: Secondary | ICD-10-CM

## 2023-08-07 DIAGNOSIS — F17210 Nicotine dependence, cigarettes, uncomplicated: Secondary | ICD-10-CM

## 2023-08-07 DIAGNOSIS — Z87891 Personal history of nicotine dependence: Secondary | ICD-10-CM

## 2023-08-25 ENCOUNTER — Ambulatory Visit: Payer: 59

## 2024-01-05 ENCOUNTER — Other Ambulatory Visit: Payer: Self-pay | Admitting: Emergency Medicine

## 2024-01-08 ENCOUNTER — Ambulatory Visit: Payer: BLUE CROSS/BLUE SHIELD | Admitting: Emergency Medicine

## 2024-04-01 ENCOUNTER — Other Ambulatory Visit: Payer: Self-pay | Admitting: Emergency Medicine

## 2024-04-01 NOTE — Telephone Encounter (Signed)
 Copied from CRM [id number]. Topic: Clinical - Medication Refill >> Apr 01, 2024  3:00 PM Melissa C wrote: Medication: hydrochlorothiazide  (HYDRODIURIL ) 12.5 MG tablet benazepril  (LOTENSIN ) 20 MG tablet amLODipine  (NORVASC ) 5 MG tablet   Has the patient contacted their pharmacy? Yes (Agent: If no, request that the patient contact the pharmacy for the refill. If patient does not wish to contact the pharmacy document the reason why and proceed with request.) (Agent: If yes, when and what did the pharmacy advise?)  Pharmacy advised patient call doctors office- most likely because 2 of the prescriptions were originally written by another physician, however looking at her last PCP note he encouraged her to continue all 3 daily. Patient stated she is almost in desperate need of  hydrochlorothiazide  and she usually gets 90 day supply of all 3.   This is the patient's preferred pharmacy:   [id number] - [place],  - [address] [place] [address] Phone: [phone] Fax: [phone]  Is this the correct pharmacy for this prescription? Yes If no, delete pharmacy and type the correct one.   Has the prescription been filled recently? No  Is the patient out of the medication? No  Has the patient been seen for an appointment in the last year OR does the patient have an upcoming appointment? Yes  Can we respond through MyChart? No  Agent: Please be advised that Rx refills may take up to 3 business days. We ask that you follow-up with your pharmacy.

## 2024-04-14 ENCOUNTER — Ambulatory Visit (INDEPENDENT_AMBULATORY_CARE_PROVIDER_SITE_OTHER): Admitting: Emergency Medicine

## 2024-04-14 ENCOUNTER — Encounter: Payer: Self-pay | Admitting: Emergency Medicine

## 2024-04-14 VITALS — BP 128/78 | HR 79 | Temp 98.1°F | Ht 73.0 in | Wt 184.0 lb

## 2024-04-14 DIAGNOSIS — I10 Essential (primary) hypertension: Secondary | ICD-10-CM

## 2024-04-14 DIAGNOSIS — Z1211 Encounter for screening for malignant neoplasm of colon: Secondary | ICD-10-CM | POA: Diagnosis not present

## 2024-04-14 DIAGNOSIS — Z1231 Encounter for screening mammogram for malignant neoplasm of breast: Secondary | ICD-10-CM | POA: Diagnosis not present

## 2024-04-14 DIAGNOSIS — F17200 Nicotine dependence, unspecified, uncomplicated: Secondary | ICD-10-CM

## 2024-04-14 MED ORDER — AMLODIPINE BESYLATE 5 MG PO TABS: 5.0000 mg | ORAL_TABLET | Freq: Every day | ORAL | 3 refills | Status: AC

## 2024-04-14 MED ORDER — BENAZEPRIL HCL 20 MG PO TABS: 20.0000 mg | ORAL_TABLET | Freq: Every day | ORAL | 3 refills | Status: AC

## 2024-04-14 MED ORDER — HYDROCHLOROTHIAZIDE 12.5 MG PO TABS: 12.5000 mg | ORAL_TABLET | Freq: Every day | ORAL | 3 refills | Status: AC

## 2024-04-14 NOTE — Assessment & Plan Note (Signed)
 Cardiovascular and cancer risks associated with smoking discussed. Smoking cessation advice given.

## 2024-04-14 NOTE — Assessment & Plan Note (Signed)
 BP Readings from Last 3 Encounters:  04/14/24 128/78  07/10/23 136/86  02/22/21 139/83  Well-controlled hypertension Cardiovascular risks associated with hypertension discussed Continue amlodipine  5 mg daily, benazepril  20 mg daily, and hydrochlorothiazide  12.5 mg daily Does not want to do blood work today Wants to follow-up in 1 year Diet and nutrition discussed

## 2024-04-14 NOTE — Patient Instructions (Signed)
 Hypertension, Adult High blood pressure (hypertension) is when the force of blood pumping through the arteries is too strong. The arteries are the blood vessels that carry blood from the heart throughout the body. Hypertension forces the heart to work harder to pump blood and may cause arteries to become narrow or stiff. Untreated or uncontrolled hypertension can lead to a heart attack, heart failure, a stroke, kidney disease, and other problems. A blood pressure reading consists of a higher number over a lower number. Ideally, your blood pressure should be below 120/80. The first ("top") number is called the systolic pressure. It is a measure of the pressure in your arteries as your heart beats. The second ("bottom") number is called the diastolic pressure. It is a measure of the pressure in your arteries as the heart relaxes. What are the causes? The exact cause of this condition is not known. There are some conditions that result in high blood pressure. What increases the risk? Certain factors may make you more likely to develop high blood pressure. Some of these risk factors are under your control, including: Smoking. Not getting enough exercise or physical activity. Being overweight. Having too much fat, sugar, calories, or salt (sodium) in your diet. Drinking too much alcohol. Other risk factors include: Having a personal history of heart disease, diabetes, high cholesterol, or kidney disease. Stress. Having a family history of high blood pressure and high cholesterol. Having obstructive sleep apnea. Age. The risk increases with age. What are the signs or symptoms? High blood pressure may not cause symptoms. Very high blood pressure (hypertensive crisis) may cause: Headache. Fast or irregular heartbeats (palpitations). Shortness of breath. Nosebleed. Nausea and vomiting. Vision changes. Severe chest pain, dizziness, and seizures. How is this diagnosed? This condition is diagnosed by  measuring your blood pressure while you are seated, with your arm resting on a flat surface, your legs uncrossed, and your feet flat on the floor. The cuff of the blood pressure monitor will be placed directly against the skin of your upper arm at the level of your heart. Blood pressure should be measured at least twice using the same arm. Certain conditions can cause a difference in blood pressure between your right and left arms. If you have a high blood pressure reading during one visit or you have normal blood pressure with other risk factors, you may be asked to: Return on a different day to have your blood pressure checked again. Monitor your blood pressure at home for 1 week or longer. If you are diagnosed with hypertension, you may have other blood or imaging tests to help your health care provider understand your overall risk for other conditions. How is this treated? This condition is treated by making healthy lifestyle changes, such as eating healthy foods, exercising more, and reducing your alcohol intake. You may be referred for counseling on a healthy diet and physical activity. Your health care provider may prescribe medicine if lifestyle changes are not enough to get your blood pressure under control and if: Your systolic blood pressure is above 130. Your diastolic blood pressure is above 80. Your personal target blood pressure may vary depending on your medical conditions, your age, and other factors. Follow these instructions at home: Eating and drinking  Eat a diet that is high in fiber and potassium, and low in sodium, added sugar, and fat. An example of this eating plan is called the DASH diet. DASH stands for Dietary Approaches to Stop Hypertension. To eat this way: Eat  plenty of fresh fruits and vegetables. Try to fill one half of your plate at each meal with fruits and vegetables. Eat whole grains, such as whole-wheat pasta, brown rice, or whole-grain bread. Fill about one  fourth of your plate with whole grains. Eat or drink low-fat dairy products, such as skim milk or low-fat yogurt. Avoid fatty cuts of meat, processed or cured meats, and poultry with skin. Fill about one fourth of your plate with lean proteins, such as fish, chicken without skin, beans, eggs, or tofu. Avoid pre-made and processed foods. These tend to be higher in sodium, added sugar, and fat. Reduce your daily sodium intake. Many people with hypertension should eat less than 1,500 mg of sodium a day. Do not drink alcohol if: Your health care provider tells you not to drink. You are pregnant, may be pregnant, or are planning to become pregnant. If you drink alcohol: Limit how much you have to: 0-1 drink a day for women. 0-2 drinks a day for men. Know how much alcohol is in your drink. In the [place] one drink equals one 12 oz bottle of beer (355 mL), one 5 oz glass of wine (148 mL), or one 1 oz glass of hard liquor (44 mL). Lifestyle  Work with your health care provider to maintain a healthy body weight or to lose weight. Ask what an ideal weight is for you. Get at least 30 minutes of exercise that causes your heart to beat faster (aerobic exercise) most days of the week. Activities may include walking, swimming, or biking. Include exercise to strengthen your muscles (resistance exercise), such as Pilates or lifting weights, as part of your weekly exercise routine. Try to do these types of exercises for 30 minutes at least 3 days a week. Do not use any products that contain nicotine or tobacco. These products include cigarettes, chewing tobacco, and vaping devices, such as e-cigarettes. If you need help quitting, ask your health care provider. Monitor your blood pressure at home as told by your health care provider. Keep all follow-up visits. This is important. Medicines Take over-the-counter and prescription medicines only as told by your health care provider. Follow directions carefully. Blood  pressure medicines must be taken as prescribed. Do not skip doses of blood pressure medicine. Doing this puts you at risk for problems and can make the medicine less effective. Ask your health care provider about side effects or reactions to medicines that you should watch for. Contact a health care provider if you: Think you are having a reaction to a medicine you are taking. Have headaches that keep coming back (recurring). Feel dizzy. Have swelling in your ankles. Have trouble with your vision. Get help right away if you: Develop a severe headache or confusion. Have unusual weakness or numbness. Feel faint. Have severe pain in your chest or abdomen. Vomit repeatedly. Have trouble breathing. These symptoms may be an emergency. Get help right away. Call 911. Do not wait to see if the symptoms will go away. Do not drive yourself to the hospital. Summary Hypertension is when the force of blood pumping through your arteries is too strong. If this condition is not controlled, it may put you at risk for serious complications. Your personal target blood pressure may vary depending on your medical conditions, your age, and other factors. For most people, a normal blood pressure is less than 120/80. Hypertension is treated with lifestyle changes, medicines, or a combination of both. Lifestyle changes include losing weight, eating a healthy,  low-sodium diet, exercising more, and limiting alcohol. This information is not intended to replace advice given to you by your health care provider. Make sure you discuss any questions you have with your health care provider. Document Revised: 07/10/2021 Document Reviewed: 07/10/2021 Elsevier Patient Education  2024 [employer].

## 2024-04-14 NOTE — Progress Notes (Signed)
 Robin Day 63 y.o.   Chief Complaint  Patient presents with   Medication Refill    Patient here for medication refill, needing all her medications refilled. No other concerns     HISTORY OF PRESENT ILLNESS: This is a 63 y.o. female here for follow-up of hypertension Still smoking Needs medication refills No other complaints or medical concerns today. BP Readings from Last 3 Encounters:  04/14/24 128/78  07/10/23 136/86  02/22/21 139/83     Medication Refill Pertinent negatives include no abdominal pain, chest pain, chills, congestion, coughing, fever, headaches, nausea, rash, sore throat or vomiting.     Prior to Admission medications   Medication Sig Start Date End Date Taking? Authorizing Provider  amLODipine  (NORVASC ) 5 MG tablet Take 1 tablet (5 mg total) by mouth daily. 04/15/22  Yes Masoud, Sheralyn, MD  aspirin EC 81 MG tablet Take 81 mg by mouth daily. Swallow whole.   Yes [provider]  benazepril  (LOTENSIN ) 20 MG tablet TAKE 1 TABLET BY MOUTH EVERY DAY 01/05/24  Yes Rocio Roam, Emil Schanz, MD  cetirizine (ZYRTEC) 10 MG tablet Take 10 mg by mouth daily.   Yes [provider]  hydrochlorothiazide  (HYDRODIURIL ) 12.5 MG tablet Take 1 tablet (12.5 mg total) by mouth daily. 04/15/22  Yes Masoud, Sheralyn, MD  fluticasone  (FLONASE ) 50 MCG/ACT nasal spray Place 2 sprays into both nostrils daily. Patient not taking: Reported on 04/14/2024 04/15/22   Britta Sheralyn, MD    No Known Allergies  Patient Active Problem List   Diagnosis Date Noted   Primary hypertension 02/22/2021   Alcohol abuse 02/22/2021   Current smoker 02/22/2021    Past Medical History:  Diagnosis Date   Emphysema lung (HCC)    Hypertension     Past Surgical History:  Procedure Laterality Date   CESAREAN SECTION     ROTATOR CUFF REPAIR      Social History   Socioeconomic History   Marital status: Divorced    Spouse name: Not on file   Number of children: Not on file    Years of education: Not on file   Highest education level: Not on file  Occupational History   Not on file  Tobacco Use   Smoking status: Every Day    Current packs/day: 0.00    Average packs/day: 0.8 packs/day for 41.0 years (30.8 ttl pk-yrs)    Types: Cigarettes    Start date: 02/1977    Last attempt to quit: 02/2018    Years since quitting: 6.1   Smokeless tobacco: Never  Substance and Sexual Activity   Alcohol use: No   Drug use: No   Sexual activity: Not Currently    Birth control/protection: Post-menopausal  Other Topics Concern   Not on file  Social History Narrative   Not on file   Social Drivers of Health   Financial Resource Strain: Not on file  Food Insecurity: Not on file  Transportation Needs: Not on file  Physical Activity: Not on file  Stress: Not on file  Social Connections: Not on file  Intimate Partner Violence: Not on file    Family History  Problem Relation Age of Onset   Stroke Mother    Cancer Sister    Breast cancer Neg Hx      Review of Systems  Constitutional: Negative.  Negative for chills and fever.  HENT: Negative.  Negative for congestion and sore throat.   Respiratory: Negative.  Negative for cough and shortness of breath.   Cardiovascular:  Negative.  Negative for chest pain and palpitations.  Gastrointestinal:  Negative for abdominal pain, diarrhea, nausea and vomiting.  Genitourinary: Negative.  Negative for dysuria and hematuria.  Skin: Negative.  Negative for rash.  Neurological: Negative.  Negative for dizziness and headaches.  All other systems reviewed and are negative.   Vitals:   04/14/24 0905  BP: 128/78  Pulse: 79  Temp: 98.1 F (36.7 C)  SpO2: 99%    Physical Exam Vitals reviewed.  Constitutional:      Appearance: Normal appearance.  HENT:     Head: Normocephalic.  Eyes:     Extraocular Movements: Extraocular movements intact.     Pupils: Pupils are equal, round, and reactive to light.  Cardiovascular:      Rate and Rhythm: Normal rate and regular rhythm.     Pulses: Normal pulses.     Heart sounds: Normal heart sounds.  Pulmonary:     Effort: Pulmonary effort is normal.     Breath sounds: Normal breath sounds.  Skin:    General: Skin is warm and dry.  Neurological:     Mental Status: She is alert and oriented to person, place, and time.  Psychiatric:        Mood and Affect: Mood normal.        Behavior: Behavior normal.      ASSESSMENT & PLAN: A total of 42 minutes was spent with the patient and counseling/coordination of care regarding preparing for this visit, review of most recent office visit notes, review of multiple chronic medical conditions and their management, cardiovascular risks associated with hypertension, review of all medications, review of most recent bloodwork results, review of health maintenance items and need for breast and colon cancer screening, education on nutrition, prognosis, documentation, and need for follow up.   Problem List Items Addressed This Visit       Cardiovascular and Mediastinum   Primary hypertension - Primary   BP Readings from Last 3 Encounters:  04/14/24 128/78  07/10/23 136/86  02/22/21 139/83  Well-controlled hypertension Cardiovascular risks associated with hypertension discussed Continue amlodipine  5 mg daily, benazepril  20 mg daily, and hydrochlorothiazide  12.5 mg daily Does not want to do blood work today Wants to follow-up in 1 year Diet and nutrition discussed       Relevant Medications   amLODipine  (NORVASC ) 5 MG tablet   hydrochlorothiazide  (HYDRODIURIL ) 12.5 MG tablet   benazepril  (LOTENSIN ) 20 MG tablet     Other   Current smoker   Cardiovascular and cancer risks associated with smoking discussed. Smoking cessation advice given         Other Visit Diagnoses       Screening mammogram for breast cancer       Relevant Orders   MM Digital Screening     Screening for colon cancer       Relevant Orders    Ambulatory referral to Gastroenterology      Patient Instructions  Hypertension, Adult High blood pressure (hypertension) is when the force of blood pumping through the arteries is too strong. The arteries are the blood vessels that carry blood from the heart throughout the body. Hypertension forces the heart to work harder to pump blood and may cause arteries to become narrow or stiff. Untreated or uncontrolled hypertension can lead to a heart attack, heart failure, a stroke, kidney disease, and other problems. A blood pressure reading consists of a higher number over a lower number. Ideally, your blood pressure should be below 120/80.  The first (top) number is called the systolic pressure. It is a measure of the pressure in your arteries as your heart beats. The second (bottom) number is called the diastolic pressure. It is a measure of the pressure in your arteries as the heart relaxes. What are the causes? The exact cause of this condition is not known. There are some conditions that result in high blood pressure. What increases the risk? Certain factors may make you more likely to develop high blood pressure. Some of these risk factors are under your control, including: Smoking. Not getting enough exercise or physical activity. Being overweight. Having too much fat, sugar, calories, or salt (sodium) in your diet. Drinking too much alcohol. Other risk factors include: Having a personal history of heart disease, diabetes, high cholesterol, or kidney disease. Stress. Having a family history of high blood pressure and high cholesterol. Having obstructive sleep apnea. Age. The risk increases with age. What are the signs or symptoms? High blood pressure may not cause symptoms. Very high blood pressure (hypertensive crisis) may cause: Headache. Fast or irregular heartbeats (palpitations). Shortness of breath. Nosebleed. Nausea and vomiting. Vision changes. Severe chest pain,  dizziness, and seizures. How is this diagnosed? This condition is diagnosed by measuring your blood pressure while you are seated, with your arm resting on a flat surface, your legs uncrossed, and your feet flat on the floor. The cuff of the blood pressure monitor will be placed directly against the skin of your upper arm at the level of your heart. Blood pressure should be measured at least twice using the same arm. Certain conditions can cause a difference in blood pressure between your right and left arms. If you have a high blood pressure reading during one visit or you have normal blood pressure with other risk factors, you may be asked to: Return on a different day to have your blood pressure checked again. Monitor your blood pressure at home for 1 week or longer. If you are diagnosed with hypertension, you may have other blood or imaging tests to help your health care provider understand your overall risk for other conditions. How is this treated? This condition is treated by making healthy lifestyle changes, such as eating healthy foods, exercising more, and reducing your alcohol intake. You may be referred for counseling on a healthy diet and physical activity. Your health care provider may prescribe medicine if lifestyle changes are not enough to get your blood pressure under control and if: Your systolic blood pressure is above 130. Your diastolic blood pressure is above 80. Your personal target blood pressure may vary depending on your medical conditions, your age, and other factors. Follow these instructions at home: Eating and drinking  Eat a diet that is high in fiber and potassium, and low in sodium, added sugar, and fat. An example of this eating plan is called the DASH diet. DASH stands for Dietary Approaches to Stop Hypertension. To eat this way: Eat plenty of fresh fruits and vegetables. Try to fill one half of your plate at each meal with fruits and vegetables. Eat whole  grains, such as whole-wheat pasta, brown rice, or whole-grain bread. Fill about one fourth of your plate with whole grains. Eat or drink low-fat dairy products, such as skim milk or low-fat yogurt. Avoid fatty cuts of meat, processed or cured meats, and poultry with skin. Fill about one fourth of your plate with lean proteins, such as fish, chicken without skin, beans, eggs, or tofu. Avoid pre-made and  processed foods. These tend to be higher in sodium, added sugar, and fat. Reduce your daily sodium intake. Many people with hypertension should eat less than 1,500 mg of sodium a day. Do not drink alcohol if: Your health care provider tells you not to drink. You are pregnant, may be pregnant, or are planning to become pregnant. If you drink alcohol: Limit how much you have to: 0-1 drink a day for women. 0-2 drinks a day for men. Know how much alcohol is in your drink. In the [place] one drink equals one 12 oz bottle of beer (355 mL), one 5 oz glass of wine (148 mL), or one 1 oz glass of hard liquor (44 mL). Lifestyle  Work with your health care provider to maintain a healthy body weight or to lose weight. Ask what an ideal weight is for you. Get at least 30 minutes of exercise that causes your heart to beat faster (aerobic exercise) most days of the week. Activities may include walking, swimming, or biking. Include exercise to strengthen your muscles (resistance exercise), such as Pilates or lifting weights, as part of your weekly exercise routine. Try to do these types of exercises for 30 minutes at least 3 days a week. Do not use any products that contain nicotine or tobacco. These products include cigarettes, chewing tobacco, and vaping devices, such as e-cigarettes. If you need help quitting, ask your health care provider. Monitor your blood pressure at home as told by your health care provider. Keep all follow-up visits. This is important. Medicines Take over-the-counter and prescription  medicines only as told by your health care provider. Follow directions carefully. Blood pressure medicines must be taken as prescribed. Do not skip doses of blood pressure medicine. Doing this puts you at risk for problems and can make the medicine less effective. Ask your health care provider about side effects or reactions to medicines that you should watch for. Contact a health care provider if you: Think you are having a reaction to a medicine you are taking. Have headaches that keep coming back (recurring). Feel dizzy. Have swelling in your ankles. Have trouble with your vision. Get help right away if you: Develop a severe headache or confusion. Have unusual weakness or numbness. Feel faint. Have severe pain in your chest or abdomen. Vomit repeatedly. Have trouble breathing. These symptoms may be an emergency. Get help right away. Call 911. Do not wait to see if the symptoms will go away. Do not drive yourself to the hospital. Summary Hypertension is when the force of blood pumping through your arteries is too strong. If this condition is not controlled, it may put you at risk for serious complications. Your personal target blood pressure may vary depending on your medical conditions, your age, and other factors. For most people, a normal blood pressure is less than 120/80. Hypertension is treated with lifestyle changes, medicines, or a combination of both. Lifestyle changes include losing weight, eating a healthy, low-sodium diet, exercising more, and limiting alcohol. This information is not intended to replace advice given to you by your health care provider. Make sure you discuss any questions you have with your health care provider. Document Revised: 07/10/2021 Document Reviewed: 07/10/2021 [employer] Patient Education  2024 [employer]     Emil Schaumann, MD [facility]

## 2024-07-19 ENCOUNTER — Other Ambulatory Visit: Payer: Self-pay | Admitting: Emergency Medicine
